# Patient Record
Sex: Male | Born: 1980 | Hispanic: No | Marital: Married
Health system: Southern US, Community
[De-identification: ages and names within clinical notes are randomized; demographics above are authoritative.]

---

## 2019-11-23 ENCOUNTER — Emergency Department (HOSPITAL_COMMUNITY): Payer: BC Managed Care – PPO

## 2019-11-23 ENCOUNTER — Emergency Department (HOSPITAL_COMMUNITY)
Admission: EM | Admit: 2019-11-23 | Discharge: 2019-11-24 | Disposition: A | Discharge status: Home / Self Care | Payer: BC Managed Care – PPO | Attending: Emergency Medicine | Admitting: Emergency Medicine

## 2019-11-23 ENCOUNTER — Other Ambulatory Visit: Payer: Self-pay

## 2019-11-23 ENCOUNTER — Encounter (HOSPITAL_COMMUNITY): Payer: Self-pay

## 2019-11-23 DIAGNOSIS — R05 Cough: Secondary | ICD-10-CM | POA: Insufficient documentation

## 2019-11-23 DIAGNOSIS — R1013 Epigastric pain: Secondary | ICD-10-CM | POA: Diagnosis present

## 2019-11-23 DIAGNOSIS — K2900 Acute gastritis without bleeding: Secondary | ICD-10-CM | POA: Diagnosis not present

## 2019-11-23 DIAGNOSIS — K219 Gastro-esophageal reflux disease without esophagitis: Secondary | ICD-10-CM | POA: Diagnosis not present

## 2019-11-23 DIAGNOSIS — R208 Other disturbances of skin sensation: Secondary | ICD-10-CM | POA: Insufficient documentation

## 2019-11-23 LAB — CBC
HCT: 54.1 % — ABNORMAL HIGH (ref 39.0–52.0)
Hemoglobin: 17.9 g/dL — ABNORMAL HIGH (ref 13.0–17.0)
MCH: 29.3 pg (ref 26.0–34.0)
MCHC: 33.1 g/dL (ref 30.0–36.0)
MCV: 88.7 fL (ref 80.0–100.0)
Platelets: 306 10*3/uL (ref 150–400)
RBC: 6.1 MIL/uL — ABNORMAL HIGH (ref 4.22–5.81)
RDW: 12.3 % (ref 11.5–15.5)
WBC: 6.2 10*3/uL (ref 4.0–10.5)
nRBC: 0 % (ref 0.0–0.2)

## 2019-11-23 LAB — BASIC METABOLIC PANEL
Anion gap: 11 (ref 5–15)
BUN: 14 mg/dL (ref 6–20)
CO2: 28 mmol/L (ref 22–32)
Calcium: 10 mg/dL (ref 8.9–10.3)
Chloride: 101 mmol/L (ref 98–111)
Creatinine, Ser: 0.94 mg/dL (ref 0.61–1.24)
GFR calc Af Amer: >60 mL/min (ref >60)
GFR calc non Af Amer: >60 mL/min (ref >60)
Glucose, Bld: 87 mg/dL (ref 70–99)
Potassium: 3.9 mmol/L (ref 3.5–5.1)
Sodium: 140 mmol/L (ref 135–145)

## 2019-11-23 LAB — TROPONIN I (HIGH SENSITIVITY): Troponin I (High Sensitivity): <2 ng/L (ref <18)

## 2019-11-23 MED ORDER — ALUM & MAG HYDROXIDE-SIMETH 200-200-20 MG/5ML PO SUSP
30.0000 mL | Freq: Once | ORAL | Status: AC
  Administered 2019-11-23: 30 mL via ORAL
  Filled 2019-11-23: qty 30

## 2019-11-23 MED ORDER — FAMOTIDINE IN NACL 20-0.9 MG/50ML-% IV SOLN
20.0000 mg | Freq: Once | INTRAVENOUS | Status: AC
  Administered 2019-11-23: 20 mg via INTRAVENOUS
  Filled 2019-11-23: qty 50

## 2019-11-23 MED ORDER — ALUMINUM-MAGNESIUM-SIMETHICONE 200-200-20 MG/5ML PO SUSP: 30.0000 mL | Freq: Three times a day (TID) | ORAL | 0 refills | Status: AC

## 2019-11-23 MED ORDER — FAMOTIDINE 20 MG PO TABS: 20.0000 mg | ORAL_TABLET | Freq: Two times a day (BID) | ORAL | 0 refills | Status: AC

## 2019-11-23 MED ORDER — ONDANSETRON 4 MG PO TBDP: 4.0000 mg | ORAL_TABLET | Freq: Three times a day (TID) | ORAL | 0 refills | Status: AC | PRN

## 2019-11-23 MED ORDER — SODIUM CHLORIDE 0.9 % IV BOLUS
500.0000 mL | Freq: Once | INTRAVENOUS | Status: AC
  Administered 2019-11-23: 500 mL via INTRAVENOUS

## 2019-11-23 MED ORDER — SODIUM CHLORIDE 0.9% FLUSH
3.0000 mL | Freq: Once | INTRAVENOUS | Status: AC
  Administered 2019-11-23: 3 mL via INTRAVENOUS

## 2019-11-23 NOTE — ED Provider Notes (Signed)
[name] Provider Note   CSN: [id number] Arrival date & time: 11/23/19  2030     History Chief Complaint  Patient presents with  . Chest Pain    Austin Wilcox is a 39 y.o. male with a past medical history of GERD presenting to the ED with a chief complaint of epigastric pain, abdominal pain and chest pain.  Reports symptoms have been intermittent for the past several years.  For the past 4 days started having worsening burning sensation in his chest as well as nausea and cough.  States that symptoms are worse at night.  He was taking Nexium for several months but discontinued last month as he thought it was not effective.  Denies any shortness of breath, hemoptysis, leg swelling, history of DVT, PE, MI, recent immobilization, changes to bowel movements or hematemesis.  HPI     History reviewed. No pertinent past medical history.  There are no problems to display for this patient.   History reviewed. No pertinent surgical history.     History reviewed. No pertinent family history.  Social History   Tobacco Use  . Smoking status: Not on file  Substance Use Topics  . Alcohol use: Not on file  . Drug use: Not on file    Home Medications Prior to Admission medications   Medication Sig Start Date End Date Taking? Authorizing Provider  esomeprazole (NEXIUM) 20 MG capsule Take 20 mg by mouth every morning. 10/23/19  Yes [provider]  aluminum-magnesium hydroxide-simethicone (MAALOX) 200-200-20 MG/5ML SUSP Take 30 mLs by mouth 4 (four) times daily -  before meals and at bedtime. 11/23/19   [name], PA-C  famotidine (PEPCID) 20 MG tablet Take 1 tablet (20 mg total) by mouth 2 (two) times daily. 11/23/19   [name], PA-C  ondansetron (ZOFRAN ODT) 4 MG disintegrating tablet Take 1 tablet (4 mg total) by mouth every 8 (eight) hours as needed for nausea or vomiting. 11/23/19   [name], PA-C    Allergies    Mushroom extract  complex and Shellfish allergy  Review of Systems   Review of Systems  Constitutional: Negative for appetite change, chills and fever.  HENT: Negative for ear pain, rhinorrhea, sneezing and sore throat.   Eyes: Negative for photophobia and visual disturbance.  Respiratory: Positive for cough. Negative for chest tightness, shortness of breath and wheezing.   Cardiovascular: Positive for chest pain. Negative for palpitations.  Gastrointestinal: Positive for abdominal pain and nausea. Negative for blood in stool, constipation, diarrhea and vomiting.  Genitourinary: Negative for dysuria, hematuria and urgency.  Musculoskeletal: Negative for myalgias.  Skin: Negative for rash.  Neurological: Negative for dizziness, weakness and light-headedness.    Physical Exam Updated Vital Signs BP (!) 140/92 (BP Location: Left Arm)   Pulse 64   Temp 98.8 F (37.1 C) (Oral)   Resp 16   Ht 5' 5.75" (1.67 m)   Wt 60.3 kg   SpO2 100%   BMI 21.63 kg/m   Physical Exam Vitals and nursing note reviewed.  Constitutional:      General: He is not in acute distress.    Appearance: He is well-developed.     Comments: Speaking in complete sentences without difficulty.  No signs of respiratory distress.  HENT:     Head: Normocephalic and atraumatic.     Nose: Nose normal.  Eyes:     General: No scleral icterus.       Left eye: No discharge.  Conjunctiva/sclera: Conjunctivae normal.  Cardiovascular:     Rate and Rhythm: Normal rate and regular rhythm.     Heart sounds: Normal heart sounds. No murmur heard.  No friction rub. No gallop.   Pulmonary:     Effort: Pulmonary effort is normal. No respiratory distress.     Breath sounds: Normal breath sounds.  Abdominal:     General: Bowel sounds are normal. There is no distension.     Palpations: Abdomen is soft.     Tenderness: There is no abdominal tenderness. There is no guarding.  Musculoskeletal:        General: Normal range of motion.      Cervical back: Normal range of motion and neck supple.     Right lower leg: No tenderness. No edema.     Left lower leg: No tenderness. No edema.  Skin:    General: Skin is warm and dry.     Findings: No rash.  Neurological:     Mental Status: He is alert.     Motor: No abnormal muscle tone.     Coordination: Coordination normal.     ED Results / Procedures / Treatments   Labs (all labs ordered are listed, but only abnormal results are displayed) Labs Reviewed  CBC - Abnormal; Notable for the following components:      Result Value   RBC 6.10 (*)    Hemoglobin 17.9 (*)    HCT 54.1 (*)    All other components within normal limits  BASIC METABOLIC PANEL  TROPONIN I (HIGH SENSITIVITY)    EKG EKG Interpretation  Date/Time:  Sunday November 23 2019 20:40:05 EDT Ventricular Rate:  101 PR Interval:  138 QRS Duration: 92 QT Interval:  330 QTC Calculation: 427 R Axis:   89 Text Interpretation: Sinus tachycardia Possible Left atrial enlargement Incomplete right bundle branch block Borderline ECG No previous ECGs available Confirmed by Yao, David H ([id number]) on 11/23/2019 10:49:32 PM   Radiology DG Chest 2 View  Result Date: 11/23/2019 CLINICAL DATA:  39 year old male with chest pain, epigastric pain and globus sensation for less than a week. Vomiting last 2 days. EXAM: CHEST - 2 VIEW COMPARISON:  None. FINDINGS: Large lung volumes suggesting pulmonary hyperinflation. Normal cardiac size and mediastinal contours. Visualized tracheal air column is within normal limits. EKG button artifact. No pneumothorax or pleural effusion and both lungs appear clear. No osseous abnormality identified. Negative visible bowel gas pattern. IMPRESSION: Pulmonary hyperinflation suspected, but no acute cardiopulmonary abnormality. Electronically Signed   By: H  Hall M.D.   On: 11/23/2019 21:14    Procedures Procedures (including critical care time)  Medications Ordered in ED Medications  famotidine  (PEPCID) IVPB 20 mg premix (20 mg Intravenous New Bag/Given 11/23/19 2318)  sodium chloride flush (NS) 0.9 % injection 3 mL (3 mLs Intravenous Given 11/23/19 2200)  sodium chloride 0.9 % bolus 500 mL (500 mLs Intravenous New Bag/Given 11/23/19 2313)  alum & mag hydroxide-simeth (MAALOX/MYLANTA) 200-200-20 MG/5ML suspension 30 mL (30 mLs Oral Given 11/23/19 2314)    ED Course  I have reviewed the triage vital signs and the nursing notes.  Pertinent labs & imaging results that were available during my care of the patient were reviewed by me and considered in my medical decision making (see chart for details).    MDM Rules/Calculators/A&P                          38  year old male with  past medical history of GERD presenting to the ED with a chief complaint of chest pain, abdominal pain, nausea and and burning sensation in his chest.  States that he has had similar symptoms since 2012.  Symptoms got worse 4 days ago.  He stopped taking his Nexium a month ago because he felt like it did not work.  Symptoms are worse at night, reports cough as well.  Started having vomiting yesterday and today.  Denies any leg swelling, history of DVT, PE, MI or recent immobilization.  On exam patient is overall well-appearing.  He speaking complete sentences without difficulty.  No signs of respiratory distress noted.  Lungs are clear to auscultation bilaterally.  No lower extremity edema, erythema or calf tenderness Concerning for DVT.  EKG shows sinus tachycardia, no ischemic changes.  Chest x-ray is unremarkable.  CBC with hemoconcentration.  BMP and troponin unremarkable x1.  His symptoms have been constant for the past 4 days and worse at night.  Abdomen is soft, nontender nondistended.  I suspect that his symptoms are due to his worsening reflux versus gastritis.  Will give IV fluids, Pepcid, GI cocktail and reassess.  11:40 PM Patient with resolution of his symptoms after medications given. He is requesting discharge. He  is PERC negative, low risk by heart score for ACS so I do not believe this is due to a cardiac or pulmonary cause. Will give Pepcid, Zofran and Maalox at home and refer to GI.   Patient is hemodynamically stable, in NAD, and able to ambulate in the ED. Evaluation does not show pathology that would require ongoing emergent intervention or inpatient treatment. I explained the diagnosis to the patient. Pain has been managed and has no complaints prior to discharge. Patient is comfortable with above plan and is stable for discharge at this time. All questions were answered prior to disposition. Strict return precautions for returning to the ED were discussed. Encouraged follow up with PCP.   An After Visit Summary was printed and given to the patient.   Portions of this note were generated with Scientist, clinical (histocompatibility and immunogenetics). Dictation errors may occur despite best attempts at proofreading.  Final Clinical Impression(s) / ED Diagnoses Final diagnoses:  Gastroesophageal reflux disease, unspecified whether esophagitis present  Acute gastritis without hemorrhage, unspecified gastritis type    Rx / DC Orders ED Discharge Orders         Ordered    famotidine (PEPCID) 20 MG tablet  2 times daily     Discontinue  Reprint     11/23/19 2308    ondansetron (ZOFRAN ODT) 4 MG disintegrating tablet  Every 8 hours PRN     Discontinue  Reprint     11/23/19 2308    aluminum-magnesium hydroxide-simethicone (MAALOX) 200-200-20 MG/5ML SUSP  3 times daily before meals & bedtime     Discontinue  Reprint     11/23/19 2308           Dietrich Pates, PA-C 11/23/19 2341    Charlynne Pander, MD 11/26/19 1453

## 2019-11-23 NOTE — Discharge Instructions (Signed)
Take medications as prescribed. Follow instructions regarding foods to avoid that will worsen your reflux. Follow-up with the GI specialist listed below. Return to the ER for worsening symptoms, worsening chest pain, if you develop shortness of breath, leg swelling or vomiting or coughing up blood.

## 2019-11-23 NOTE — ED Triage Notes (Signed)
Pt reports chest pain, feeling like there is something in his throat and epigastric pain for less than a week, but states that symptoms get worse daily. Reports vomiting yesterday and today. Also reports dry cough. He does have a hx of GERD and acid reflux and is no longer medicated.

## 2020-05-18 ENCOUNTER — Encounter (HOSPITAL_COMMUNITY): Payer: Self-pay

## 2020-05-18 ENCOUNTER — Other Ambulatory Visit: Payer: Self-pay

## 2020-05-18 ENCOUNTER — Emergency Department (HOSPITAL_COMMUNITY)
Admission: EM | Admit: 2020-05-18 | Discharge: 2020-05-18 | Disposition: A | Discharge status: Home / Self Care | Payer: BC Managed Care – PPO | Attending: Emergency Medicine | Admitting: Emergency Medicine

## 2020-05-18 DIAGNOSIS — R42 Dizziness and giddiness: Secondary | ICD-10-CM | POA: Diagnosis not present

## 2020-05-18 DIAGNOSIS — R519 Headache, unspecified: Secondary | ICD-10-CM

## 2020-05-18 NOTE — ED Triage Notes (Signed)
Pt arrived via walk in, states he had a headache x3 days ago, became lightheaded, fell, no heat strike. States same episode today. Describes as room spinning dizziness, only when standing.

## 2020-05-18 NOTE — ED Provider Notes (Signed)
[facility]-EMERGENCY DEPT Provider Note  CSN: [id number] Arrival date & time: 05/18/20 1720  Chief Complaint(s) Dizziness and Headache  HPI Austin Wilcox is a 40 y.o. male    Headache Pain location:  L parietal Quality: shocks. Onset quality:  Sudden Duration: intermittent for 3 days; lasting seconds to minutes at a time. Relieved by: self resolving. Worsened by:  Nothing Associated symptoms: dizziness   Associated symptoms: no abdominal pain, no back pain, no blurred vision, no fever, no focal weakness, no nausea, no near-syncope, no numbness, no paresthesias, no photophobia, no visual change and no vomiting    Last episode was 3pm today.  Past Medical History History reviewed. No pertinent past medical history. There are no problems to display for this patient.  Home Medication(s) Prior to Admission medications   Medication Sig Start Date End Date Taking? Authorizing Provider  aluminum-magnesium hydroxide-simethicone (MAALOX) 200-200-20 MG/5ML SUSP Take 30 mLs by mouth 4 (four) times daily -  before meals and at bedtime. 11/23/19   Khatri, Hina, PA-C  esomeprazole (NEXIUM) 20 MG capsule Take 20 mg by mouth every morning. 10/23/19   [provider]  famotidine (PEPCID) 20 MG tablet Take 1 tablet (20 mg total) by mouth 2 (two) times daily. 11/23/19   Khatri, Hina, PA-C  ondansetron (ZOFRAN ODT) 4 MG disintegrating tablet Take 1 tablet (4 mg total) by mouth every 8 (eight) hours as needed for nausea or vomiting. 11/23/19   Dietrich Pates, PA-C                                                                                                                                    Past Surgical History History reviewed. No pertinent surgical history. Family History History reviewed. No pertinent family history.  Social History   Allergies Mushroom extract complex and Shellfish allergy  Review of Systems Review of Systems  Constitutional: Negative for fever.   Eyes: Negative for blurred vision and photophobia.  Cardiovascular: Negative for near-syncope.  Gastrointestinal: Negative for abdominal pain, nausea and vomiting.  Musculoskeletal: Negative for back pain.  Neurological: Positive for dizziness and headaches. Negative for focal weakness, numbness and paresthesias.   All other systems are reviewed and are negative for acute change except as noted in the HPI  Physical Exam Vital Signs  I have reviewed the triage vital signs BP (!) 123/104   Pulse 77   Temp 98.1 F (36.7 C) (Oral)   Resp (!) 21   SpO2 100%   Physical Exam Vitals reviewed.  Constitutional:      General: He is not in acute distress.    Appearance: He is well-developed and well-nourished. He is not diaphoretic.  HENT:     Head: Normocephalic and atraumatic.     Nose: Nose normal.  Eyes:     General: No scleral icterus.       Right eye: No discharge.  Left eye: No discharge.     Extraocular Movements: EOM normal.     Conjunctiva/sclera: Conjunctivae normal.     Pupils: Pupils are equal, round, and reactive to light.  Cardiovascular:     Rate and Rhythm: Normal rate and regular rhythm.     Heart sounds: No murmur heard. No friction rub. No gallop.   Pulmonary:     Effort: Pulmonary effort is normal. No respiratory distress.     Breath sounds: Normal breath sounds. No stridor. No rales.  Abdominal:     General: There is no distension.     Palpations: Abdomen is soft.     Tenderness: There is no abdominal tenderness.  Musculoskeletal:        General: No edema.     Cervical back: Normal range of motion and neck supple.     Thoracic back: Spasms and tenderness (mild) present. No bony tenderness. Normal range of motion.       Back:  Skin:    General: Skin is warm and dry.     Findings: No erythema or rash.  Neurological:     Mental Status: He is alert and oriented to person, place, and time.  Psychiatric:        Mood and Affect: Mood and affect  normal.     ED Results and Treatments Labs (all labs ordered are listed, but only abnormal results are displayed) Labs Reviewed - No data to display                                                                                                                       EKG  EKG Interpretation  Date/Time:    Ventricular Rate:    PR Interval:    QRS Duration:   QT Interval:    QTC Calculation:   R Axis:     Text Interpretation:        Radiology No results found.  Pertinent labs & imaging results that were available during my care of the patient were reviewed by me and considered in my medical decision making (see chart for details).  Medications Ordered in ED Medications - No data to display                                                                                                                                  Procedures Procedures  (including critical care time)  Medical Decision Making /  ED Course I have reviewed the nursing notes for this encounter and the patient's prior records (if available in EHR or on provided paperwork).   Austin Wilcox was evaluated in Emergency Department on 05/18/2020 for the symptoms described in the history of present illness. He was evaluated in the context of the global COVID-19 pandemic, which necessitated consideration that the patient might be at risk for infection with the SARS-CoV-2 virus that causes COVID-19. Institutional protocols and algorithms that pertain to the evaluation of patients at risk for COVID-19 are in a state of rapid change based on information released by regulatory bodies including the CDC and federal and state organizations. These policies and algorithms were followed during the patient's care in the ED.  Non focal neuro exam. No recent head trauma. No fever. Doubt meningitis. Doubt intracranial bleed. Doubt IIH. 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 Currently asymptomatic. No indication for imaging.          Final Clinical Impression(s) / ED Diagnoses Final diagnoses:  Intermittent headache   The patient appears reasonably screened and/or stabilized for discharge and I doubt any other medical condition or other [facility] requiring further screening, evaluation, or treatment in the ED at this time prior to discharge. Safe for discharge with strict return precautions.  Disposition: Discharge  Condition: Good  I have discussed the results, Dx and Tx plan with the patient/family who expressed understanding and agree(s) with the plan. Discharge instructions discussed at length. The patient/family was given strict return precautions who verbalized understanding of the instructions. No further questions at time of discharge.    ED Discharge Orders    None        Follow Up: Rometta Emery, MD [address]  [place] [address] [phone]  Call  To schedule an appointment for close follow up      This chart was dictated using voice recognition software.  Despite best efforts to proofread,  errors can occur which can change the documentation meaning.   Nira Conn, MD 05/18/20 2325

## 2020-05-18 NOTE — Discharge Instructions (Signed)
You may use over-the-counter Motrin (Ibuprofen), Acetaminophen (Tylenol), topical muscle creams such as SalonPas, Icy Hot, Bengay, etc. Please stretch, apply ice or heat (whichever helps), and have massage therapy for additional assistance.  

## 2022-03-29 IMAGING — CR DG CHEST 2V
2 series · 2 of 2 positions shown · non-contrast
Comparison: None.

CLINICAL DATA: 38-year-old male with chest pain, epigastric pain
and globus sensation for less than a week. Vomiting last 2 days.

EXAM:
CHEST - 2 VIEW

[w chest pa]
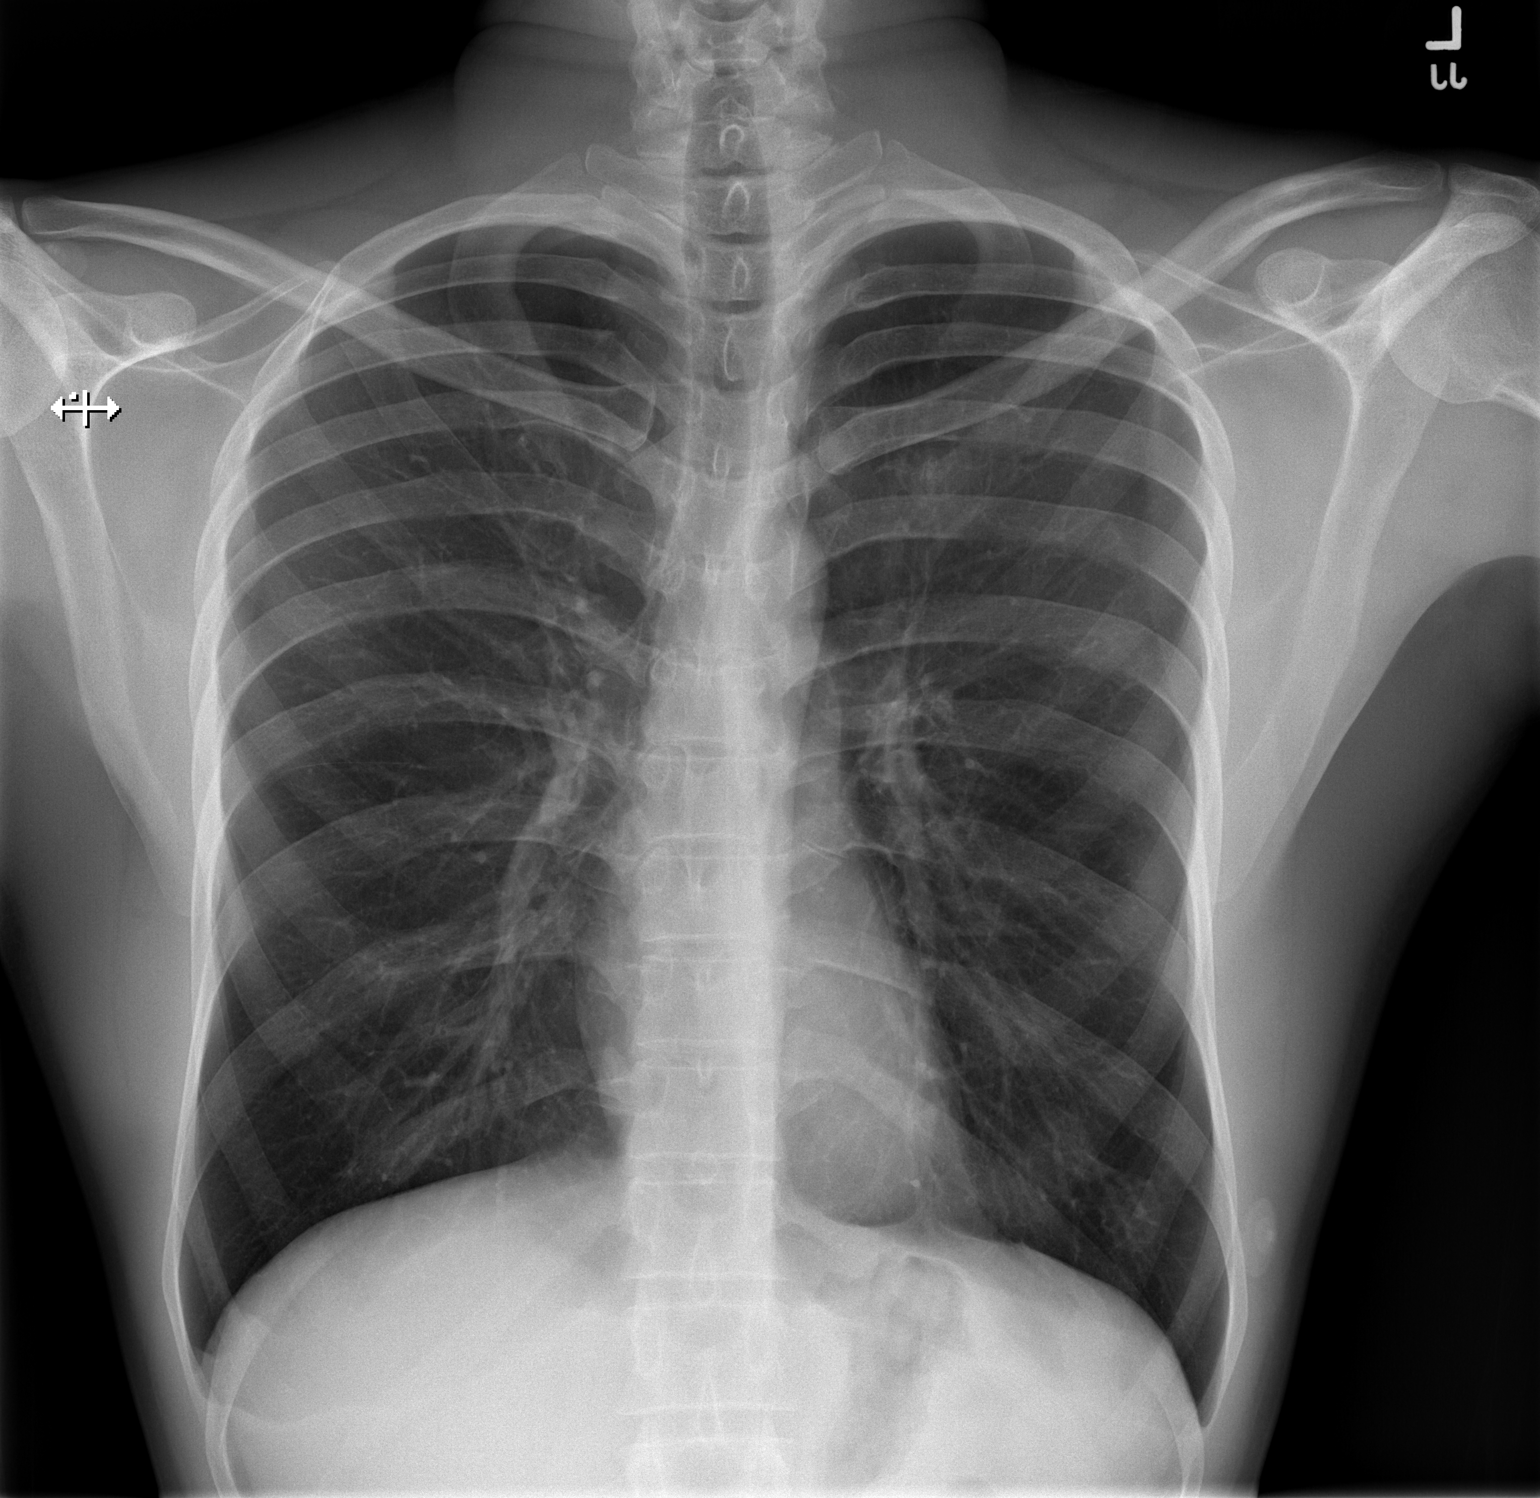

[w chest lat]
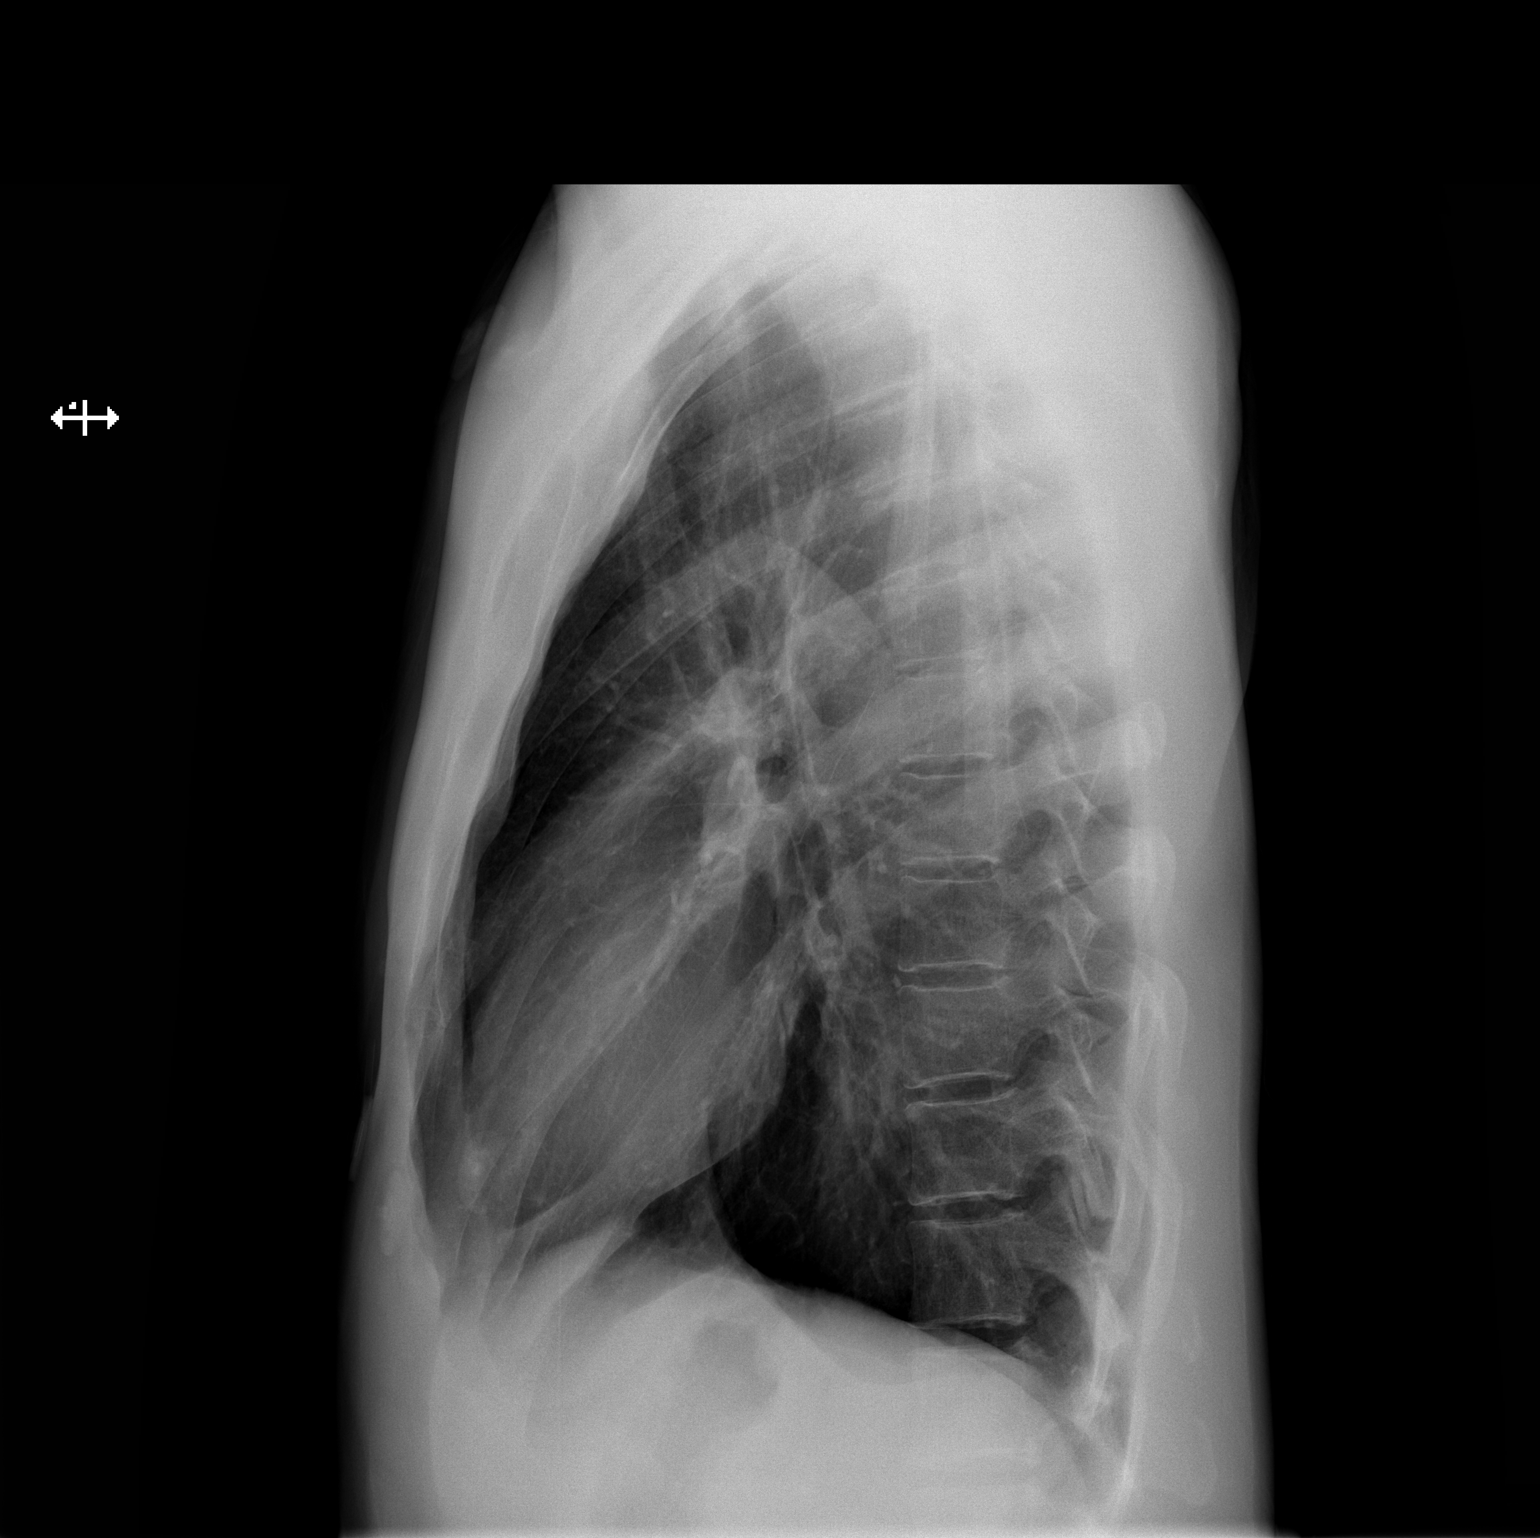

[2 of 2 positions shown; findings below may reference images not displayed]

FINDINGS: Large lung volumes suggesting pulmonary hyperinflation. Normal
cardiac size and mediastinal contours. Visualized tracheal air
column is within normal limits. EKG button artifact. No pneumothorax
or pleural effusion and both lungs appear clear.

No osseous abnormality identified. Negative visible bowel gas
pattern.
IMPRESSION: Pulmonary hyperinflation suspected, but no acute cardiopulmonary
abnormality.
